# Patient Record
Sex: Male | Born: 1975 | Race: White | Hispanic: No | Marital: Married | Smoking: Never smoker
Health system: Southern US, Community
[De-identification: ages and names within clinical notes are randomized; demographics above are authoritative.]

---

## 2011-09-30 ENCOUNTER — Emergency Department: Payer: Self-pay | Admitting: Emergency Medicine

## 2011-10-08 ENCOUNTER — Ambulatory Visit: Payer: Self-pay | Admitting: Internal Medicine

## 2011-10-18 ENCOUNTER — Ambulatory Visit: Payer: Self-pay | Admitting: Internal Medicine

## 2015-02-18 ENCOUNTER — Emergency Department: Payer: Self-pay | Admitting: Student

## 2016-02-22 ENCOUNTER — Encounter: Payer: Self-pay | Admitting: Emergency Medicine

## 2016-02-22 ENCOUNTER — Emergency Department
Admission: EM | Admit: 2016-02-22 | Discharge: 2016-02-22 | Disposition: A | Discharge status: Home / Self Care | Payer: BLUE CROSS/BLUE SHIELD | Attending: Emergency Medicine | Admitting: Emergency Medicine

## 2016-02-22 ENCOUNTER — Emergency Department: Payer: BLUE CROSS/BLUE SHIELD

## 2016-02-22 DIAGNOSIS — Y92410 Unspecified street and highway as the place of occurrence of the external cause: Secondary | ICD-10-CM | POA: Diagnosis not present

## 2016-02-22 DIAGNOSIS — Y998 Other external cause status: Secondary | ICD-10-CM | POA: Insufficient documentation

## 2016-02-22 DIAGNOSIS — S60222A Contusion of left hand, initial encounter: Secondary | ICD-10-CM

## 2016-02-22 DIAGNOSIS — Y9389 Activity, other specified: Secondary | ICD-10-CM | POA: Insufficient documentation

## 2016-02-22 DIAGNOSIS — S6992XA Unspecified injury of left wrist, hand and finger(s), initial encounter: Secondary | ICD-10-CM | POA: Diagnosis present
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
## 2016-02-22 NOTE — Discharge Instructions (Signed)
Hand Contusion  A hand contusion is a deep bruise on your hand area. Contusions are the result of an injury that caused bleeding under the skin. The contusion may turn blue, purple, or yellow. Minor injuries will give you a painless contusion, but more severe contusions may stay painful and swollen for a few weeks.  CAUSES   A contusion is usually caused by a blow, trauma, or direct force to an area of the body.  SYMPTOMS    Swelling and redness of the injured area.   Discoloration of the injured area.   Tenderness and soreness of the injured area.   Pain.  DIAGNOSIS   The diagnosis can be made by taking a history and performing a physical exam. An X-ray, CT scan, or MRI may be needed to determine if there were any associated injuries, such as broken bones (fractures).  TREATMENT   Often, the best treatment for a hand contusion is resting, elevating, icing, and applying cold compresses to the injured area. Over-the-counter medicines may also be recommended for pain control.  HOME CARE INSTRUCTIONS    Put ice on the injured area.    Put ice in a plastic bag.    Place a towel between your skin and the bag.    Leave the ice on for 15-20 minutes, 03-04 times a day.   Only take over-the-counter or prescription medicines as directed by your caregiver. Your caregiver may recommend avoiding anti-inflammatory medicines (aspirin, ibuprofen, and naproxen) for 48 hours because these medicines may increase bruising.   If told, use an elastic wrap as directed. This can help reduce swelling. You may remove the wrap for sleeping, showering, and bathing. If your fingers become numb, cold, or blue, take the wrap off and reapply it more loosely.   Elevate your hand with pillows to reduce swelling.   Avoid overusing your hand if it is painful.  SEEK IMMEDIATE MEDICAL CARE IF:    You have increased redness, swelling, or pain in your hand.   Your swelling or pain is not relieved with medicines.   You have loss of feeling in  your hand or are unable to move your fingers.   Your hand turns cold or blue.   You have pain when you move your fingers.   Your hand becomes warm to the touch.   Your contusion does not improve in 2 days.  MAKE SURE YOU:    Understand these instructions.   Will watch your condition.   Will get help right away if you are not doing well or get worse.     This information is not intended to replace advice given to you by your health care provider. Make sure you discuss any questions you have with your health care provider.     Document Released: 05/28/2002 Document Revised: 08/30/2012 Document Reviewed: 05/29/2012  [employer] Interactive Patient Education 2016 [employer]
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
## 2017-01-02 ENCOUNTER — Ambulatory Visit
Admission: EM | Admit: 2017-01-02 | Discharge: 2017-01-02 | Disposition: A | Discharge status: Home / Self Care | Payer: BLUE CROSS/BLUE SHIELD
